# Patient Record
Sex: Female | Born: 1985 | Race: White | Hispanic: No | Smoking: Current every day smoker
Health system: Southern US, Community
[De-identification: ages and names within clinical notes are randomized; demographics above are authoritative.]

## PROBLEM LIST (undated history)

## (undated) DIAGNOSIS — I10 Essential (primary) hypertension: Secondary | ICD-10-CM

## (undated) DIAGNOSIS — F419 Anxiety disorder, unspecified: Secondary | ICD-10-CM

## (undated) HISTORY — PX: CHOLECYSTECTOMY: SHX55

---

## 2016-09-13 ENCOUNTER — Emergency Department (HOSPITAL_COMMUNITY): Payer: Medicare Other

## 2016-09-13 ENCOUNTER — Encounter (HOSPITAL_COMMUNITY): Payer: Self-pay | Admitting: Emergency Medicine

## 2016-09-13 ENCOUNTER — Emergency Department (HOSPITAL_COMMUNITY)
Admission: EM | Admit: 2016-09-13 | Discharge: 2016-09-13 | Disposition: A | Discharge status: Home / Self Care | Payer: Medicare Other | Attending: Emergency Medicine | Admitting: Emergency Medicine

## 2016-09-13 DIAGNOSIS — R51 Headache: Secondary | ICD-10-CM | POA: Insufficient documentation

## 2016-09-13 DIAGNOSIS — F17200 Nicotine dependence, unspecified, uncomplicated: Secondary | ICD-10-CM | POA: Insufficient documentation

## 2016-09-13 DIAGNOSIS — Y929 Unspecified place or not applicable: Secondary | ICD-10-CM | POA: Insufficient documentation

## 2016-09-13 DIAGNOSIS — Y939 Activity, unspecified: Secondary | ICD-10-CM | POA: Diagnosis not present

## 2016-09-13 DIAGNOSIS — S199XXA Unspecified injury of neck, initial encounter: Secondary | ICD-10-CM | POA: Diagnosis present

## 2016-09-13 DIAGNOSIS — S1083XA Contusion of other specified part of neck, initial encounter: Secondary | ICD-10-CM | POA: Diagnosis not present

## 2016-09-13 DIAGNOSIS — F419 Anxiety disorder, unspecified: Secondary | ICD-10-CM | POA: Diagnosis not present

## 2016-09-13 DIAGNOSIS — M549 Dorsalgia, unspecified: Secondary | ICD-10-CM | POA: Insufficient documentation

## 2016-09-13 DIAGNOSIS — T07XXXA Unspecified multiple injuries, initial encounter: Secondary | ICD-10-CM

## 2016-09-13 DIAGNOSIS — Y999 Unspecified external cause status: Secondary | ICD-10-CM | POA: Insufficient documentation

## 2016-09-13 HISTORY — DX: Anxiety disorder, unspecified: F41.9

## 2016-09-13 LAB — POC URINE PREG, ED: Preg Test, Ur: NEGATIVE

## 2016-09-13 MED ORDER — LORAZEPAM 1 MG PO TABS: 1.0000 mg | ORAL_TABLET | Freq: Three times a day (TID) | ORAL | 0 refills | Status: DC | PRN

## 2016-09-13 MED ORDER — DIAZEPAM 5 MG PO TABS
10.0000 mg | ORAL_TABLET | Freq: Once | ORAL | Status: AC
  Administered 2016-09-13: 10 mg via ORAL
  Filled 2016-09-13: qty 2

## 2016-09-13 MED ORDER — ONDANSETRON HCL 4 MG PO TABS
4.0000 mg | ORAL_TABLET | Freq: Once | ORAL | Status: AC
  Administered 2016-09-13: 4 mg via ORAL
  Filled 2016-09-13: qty 1

## 2016-09-13 MED ORDER — CYCLOBENZAPRINE HCL 10 MG PO TABS: 10.0000 mg | ORAL_TABLET | Freq: Three times a day (TID) | ORAL | 0 refills | Status: DC

## 2016-09-13 MED ORDER — HYDROCODONE-ACETAMINOPHEN 5-325 MG PO TABS
2.0000 | ORAL_TABLET | Freq: Once | ORAL | Status: AC
  Administered 2016-09-13: 2 via ORAL
  Filled 2016-09-13: qty 2

## 2016-09-13 MED ORDER — HYDROCODONE-ACETAMINOPHEN 5-325 MG PO TABS: 1.0000 | ORAL_TABLET | ORAL | 0 refills | Status: DC | PRN

## 2016-09-13 NOTE — ED Notes (Signed)
Meal tray given 

## 2016-09-13 NOTE — ED Notes (Signed)
Drink/Crackers given. Meal tray ordered

## 2016-09-13 NOTE — ED Notes (Signed)
Pt taken to xray/ct 

## 2016-09-13 NOTE — Discharge Instructions (Signed)
CT scan done of the maxillofacial bones, soft tissue neck, are negative. X-ray of the chest, and x-ray of the ribs are also negative for fracture. Your blood pressure is elevated, otherwise your vital signs are well within normal limits. Use Flexeril 3 times daily for spasm pain. Please use Tylenol or her ibuprofen for mild pain, use Norco for more severe pain. This medication may cause drowsiness, and/or constipation. Please use this medication with caution.
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
## 2017-07-22 ENCOUNTER — Encounter: Payer: Self-pay | Admitting: Obstetrics & Gynecology

## 2017-07-22 ENCOUNTER — Ambulatory Visit (INDEPENDENT_AMBULATORY_CARE_PROVIDER_SITE_OTHER): Payer: Medicare Other | Admitting: Obstetrics & Gynecology

## 2017-07-22 ENCOUNTER — Encounter (INDEPENDENT_AMBULATORY_CARE_PROVIDER_SITE_OTHER): Payer: Self-pay

## 2017-07-22 ENCOUNTER — Other Ambulatory Visit: Payer: Self-pay

## 2017-07-22 VITALS — BP 120/84 | HR 94 | Ht 67.0 in | Wt 179.0 lb

## 2017-07-22 DIAGNOSIS — Z3201 Encounter for pregnancy test, result positive: Secondary | ICD-10-CM

## 2017-07-22 DIAGNOSIS — N926 Irregular menstruation, unspecified: Secondary | ICD-10-CM

## 2017-07-22 DIAGNOSIS — Z3490 Encounter for supervision of normal pregnancy, unspecified, unspecified trimester: Secondary | ICD-10-CM

## 2017-07-22 LAB — POCT URINE PREGNANCY: Preg Test, Ur: POSITIVE — AB

## 2017-07-22 MED ORDER — OMEPRAZOLE 20 MG PO CPDR: 20.0000 mg | DELAYED_RELEASE_CAPSULE | Freq: Every day | ORAL | 6 refills | Status: AC

## 2017-07-22 NOTE — Progress Notes (Signed)
Chief Complaint  Patient presents with  . Possible Pregnancy      32 y.o. [id number] Patient's last menstrual period was 06/02/2017. The current method of family planning is none.  Outpatient Encounter Medications as of 07/22/2017  Medication Sig  . gabapentin (NEURONTIN) 300 MG capsule 300 mg 3 (three) times daily.   Marland Kitchen omeprazole (PRILOSEC) 20 MG capsule Take 1 capsule (20 mg total) by mouth daily. 1 tablet a day  . [DISCONTINUED] cyclobenzaprine (FLEXERIL) 10 MG tablet Take 1 tablet (10 mg total) by mouth 3 (three) times daily.  . [DISCONTINUED] HYDROcodone-acetaminophen (NORCO/VICODIN) 5-325 MG tablet Take 1 tablet by mouth every 4 (four) hours as needed.  . [DISCONTINUED] LORazepam (ATIVAN) 1 MG tablet Take 1 tablet (1 mg total) by mouth 3 (three) times daily as needed for anxiety.   No facility-administered encounter medications on file as of 07/22/2017.     Subjective Ashlee Porter  Presents to the office today with a recent positive pregnancy test and urine pregnancy test in the office is also positive She did have some spotting a couple of weeks ago but that resolved no pain no cramping She had her IUD removed May 07, 2017 and really did not have an interval menses As result were really unsure about her ovulation and pregnancy dating As a result I am doing a bedside point-of-care vaginal probe ultrasound this morning to evaluate pregnancy status Past Medical History:  Diagnosis Date  . Anxiety     Past Surgical History:  Procedure Laterality Date  . CHOLECYSTECTOMY      OB History    Gravida  7   Para  5   Term      Preterm      AB  1   Living  4     SAB  1   TAB      Ectopic      Multiple      Live Births              Allergies  Allergen Reactions  . Benadryl [Diphenhydramine Hcl (Sleep)]     Social History   Socioeconomic History  . Marital status: Legally Separated    Spouse name: Not on file  . Number of children: Not on  file  . Years of education: Not on file  . Highest education level: Not on file  Occupational History  . Not on file  Social Needs  . Financial resource strain: Not on file  . Food insecurity:    Worry: Not on file    Inability: Not on file  . Transportation needs:    Medical: Not on file    Non-medical: Not on file  Tobacco Use  . Smoking status: Current Every Day Smoker    Packs/day: 1.00    Types: Cigarettes  . Smokeless tobacco: Never Used  Substance and Sexual Activity  . Alcohol use: Not Currently  . Drug use: Yes    Types: Marijuana  . Sexual activity: Yes    Birth control/protection: IUD  Lifestyle  . Physical activity:    Days per week: Not on file    Minutes per session: Not on file  . Stress: Not on file  Relationships  . Social connections:    Talks on phone: Not on file    Gets together: Not on file    Attends religious service: Not on file    Active member of club or organization: Not on file  Attends meetings of clubs or organizations: Not on file    Relationship status: Not on file  Other Topics Concern  . Not on file  Social History Narrative  . Not on file    Family History  Problem Relation Age of Onset  . Cancer Paternal Grandfather        prostate  . Cancer Paternal Grandmother        lung  . Cancer Maternal Grandmother        lung  . Cancer Maternal Grandfather   . Heart disease Father   . Hypertension Mother   . Mental illness Brother   . Mental illness Sister   . Hypertension Sister   . Mental illness Brother     Medications:       Current Outpatient Medications:  .  gabapentin (NEURONTIN) 300 MG capsule, 300 mg 3 (three) times daily. , Disp: , Rfl:  .  omeprazole (PRILOSEC) 20 MG capsule, Take 1 capsule (20 mg total) by mouth daily. 1 tablet a day, Disp: 30 capsule, Rfl: 6  Objective Blood pressure 120/84, pulse 94, height 5\' 7"  (1.702 m), weight 179 lb (81.2 kg), last menstrual period 06/02/2017.  Gen WDWN female  NAD  Pertinent ROS   Labs or studies The uterus appears to be normal with a thickened endometrial stripe with multiple small cystic areas There is an area in the superior portion of the endometrium which appears to be a very small gestational sac and possibly even a very early yolk sac No fetal pole is seen and would not be consistent with this gestational sac and yolk sac appearance Therefore think she is probably right at 5 weeks relational age and since I see what appears to be a eccentric gestational sac I do not think doing ECGs is going to be helpful  We will repeat her sonogram in 2 weeks for evaluation    Impression Diagnoses this Encounter::   ICD-10-CM   1. Early stage of pregnancy Z34.90 US OB Transvaginal  2. Positive pregnancy test Z32.01 POCT urine pregnancy    Established relevant diagnosis(es):   Plan/Recommendations: Meds ordered this encounter  Medications  . omeprazole (PRILOSEC) 20 MG capsule    Sig: Take 1 capsule (20 mg total) by mouth daily. 1 tablet a day    Dispense:  30 capsule    Refill:  6    Labs or Scans Ordered: Orders Placed This Encounter  Procedures  . US OB Transvaginal  . POCT urine pregnancy    Management::  Repeat scan with NOB 2 weeks Follow up Return in about 2 weeks (around 08/05/2017) for vaginal sonogram, NOB appt.     All questions were answered.

## 2017-07-29 ENCOUNTER — Other Ambulatory Visit: Payer: Self-pay

## 2017-07-29 ENCOUNTER — Emergency Department (HOSPITAL_COMMUNITY)
Admission: EM | Admit: 2017-07-29 | Discharge: 2017-07-29 | Disposition: A | Discharge status: Home / Self Care | Payer: Medicare Other | Attending: Emergency Medicine | Admitting: Emergency Medicine

## 2017-07-29 ENCOUNTER — Encounter (HOSPITAL_COMMUNITY): Payer: Self-pay | Admitting: Emergency Medicine

## 2017-07-29 DIAGNOSIS — O208 Other hemorrhage in early pregnancy: Secondary | ICD-10-CM | POA: Diagnosis not present

## 2017-07-29 DIAGNOSIS — O99330 Smoking (tobacco) complicating pregnancy, unspecified trimester: Secondary | ICD-10-CM | POA: Diagnosis not present

## 2017-07-29 DIAGNOSIS — F17210 Nicotine dependence, cigarettes, uncomplicated: Secondary | ICD-10-CM | POA: Diagnosis not present

## 2017-07-29 DIAGNOSIS — O200 Threatened abortion: Secondary | ICD-10-CM | POA: Insufficient documentation

## 2017-07-29 DIAGNOSIS — O10019 Pre-existing essential hypertension complicating pregnancy, unspecified trimester: Secondary | ICD-10-CM | POA: Insufficient documentation

## 2017-07-29 DIAGNOSIS — R102 Pelvic and perineal pain: Secondary | ICD-10-CM | POA: Diagnosis present

## 2017-07-29 HISTORY — DX: Essential (primary) hypertension: I10

## 2017-07-29 LAB — CBC WITH DIFFERENTIAL/PLATELET
Basophils Absolute: 0 10*3/uL (ref 0.0–0.1)
Basophils Relative: 0 %
Eosinophils Absolute: 0.2 10*3/uL (ref 0.0–0.7)
Eosinophils Relative: 2 %
HEMATOCRIT: 44.4 % (ref 36.0–46.0)
Hemoglobin: 15 g/dL (ref 12.0–15.0)
LYMPHS PCT: 23 %
Lymphs Abs: 2.8 10*3/uL (ref 0.7–4.0)
MCH: 30.2 pg (ref 26.0–34.0)
MCHC: 33.8 g/dL (ref 30.0–36.0)
MCV: 89.5 fL (ref 78.0–100.0)
Monocytes Absolute: 0.8 10*3/uL (ref 0.1–1.0)
Monocytes Relative: 7 %
NEUTROS ABS: 8.5 10*3/uL — AB (ref 1.7–7.7)
Neutrophils Relative %: 68 %
Platelets: 350 10*3/uL (ref 150–400)
RBC: 4.96 MIL/uL (ref 3.87–5.11)
RDW: 13 % (ref 11.5–15.5)
WBC: 12.4 10*3/uL — ABNORMAL HIGH (ref 4.0–10.5)

## 2017-07-29 LAB — ABO/RH: ABO/RH(D): A POS

## 2017-07-29 LAB — WET PREP, GENITAL
CLUE CELLS WET PREP: NONE SEEN
Sperm: NONE SEEN
Trich, Wet Prep: NONE SEEN
Yeast Wet Prep HPF POC: NONE SEEN

## 2017-07-29 LAB — HCG, QUANTITATIVE, PREGNANCY: HCG, BETA CHAIN, QUANT, S: 614 m[IU]/mL — AB (ref <5)

## 2017-07-29 MED ORDER — ACETAMINOPHEN 325 MG PO TABS
650.0000 mg | ORAL_TABLET | Freq: Once | ORAL | Status: AC
  Administered 2017-07-29: 650 mg via ORAL
  Filled 2017-07-29: qty 2

## 2017-07-29 NOTE — Discharge Instructions (Addendum)
Keep your scheduled appointment with Dr[facility] at 1:30 PM.  Ask Dr. Emelda FearFerguson or Dr.Eure to help you to stop smoking.  Your blood type is  A Positive (A+).  It is safe to take Tylenol as directed for pain

## 2017-07-29 NOTE — ED Provider Notes (Signed)
[facility] EMERGENCY DEPARTMENT Provider Note   CSN: [id number] Arrival date & time: 07/29/17  2027     History   Chief Complaint Chief Complaint  Patient presents with  . Abdominal Pain    HPI Ashlee Porter is a 32 y.o. female.  Planes of crampy suprapubic pain gradual onset 3 days ago becoming worse today.  Developed vaginal bleeding onset today.  She denies any lightheadedness no fever no vomiting.  She reports that she had a pelvic ultrasound 7 days ago at [facility] which showed an intrauterine pregnancy.  No urinary symptoms.  No anorexia.  No other associated symptoms.  No treatment prior to coming here nothing makes symptoms better or worse.  She is uncertain when her last normal menstrual period was.   HPI  Past Medical History:  Diagnosis Date  . Anxiety   . Hypertension     There are no active problems to display for this patient.   Past Surgical History:  Procedure Laterality Date  . CHOLECYSTECTOMY       OB History    Gravida  7   Para  5   Term      Preterm      AB  1   Living  4     SAB  1   TAB      Ectopic      Multiple      Live Births             1 miscarriage 1 stillborn  Home Medications    Prior to Admission medications   Medication Sig Start Date End Date Taking? Authorizing Provider  gabapentin (NEURONTIN) 300 MG capsule 300 mg 3 (three) times daily.  07/16/17   [provider]  omeprazole (PRILOSEC) 20 MG capsule Take 1 capsule (20 mg total) by mouth daily. 1 tablet a day 07/22/17   Lazaro Arms, MD    Family History Family History  Problem Relation Age of Onset  . Cancer Paternal Grandfather        prostate  . Cancer Paternal Grandmother        lung  . Cancer Maternal Grandmother        lung  . Cancer Maternal Grandfather   . Heart disease Father   . Hypertension Mother   . Mental illness Brother   . Mental illness Sister   . Hypertension Sister   . Mental illness Brother     Social  History Social History   Tobacco Use  . Smoking status: Current Every Day Smoker    Packs/day: 1.00    Types: Cigarettes  . Smokeless tobacco: Never Used  Substance Use Topics  . Alcohol use: Not Currently  . Drug use: Yes    Types: Marijuana     Allergies   Benadryl [diphenhydramine hcl (sleep)]   Review of Systems Review of Systems  Gastrointestinal: Positive for abdominal pain.       Suprapubic cramping  Genitourinary: Positive for vaginal bleeding.       Pregnant  All other systems reviewed and are negative.    Physical Exam Updated Vital Signs BP (!) 144/105   Temp 98 F (36.7 C)   Resp 18   Ht 5\' 7"  (1.702 m)   Wt 81.2 kg (179 lb)   LMP 06/02/2017   SpO2 95%   BMI 28.04 kg/m   Physical Exam  Constitutional: She appears well-developed and well-nourished.  HENT:  Head: Normocephalic and atraumatic.  Eyes: Pupils are  equal, round, and reactive to light. Conjunctivae are normal.  Neck: Neck supple. No tracheal deviation present. No thyromegaly present.  Cardiovascular: Normal rate and regular rhythm.  No murmur heard. Pulmonary/Chest: Effort normal and breath sounds normal.  Abdominal: Soft. Bowel sounds are normal. She exhibits no distension. There is no tenderness.  Genitourinary:  Genitourinary Comments: Exam no external lesion.  Slight amount of dark blood coming from loss.  Cervical loss closed.  No adnexal tenderness or masses.  No uterine tenderness.  Musculoskeletal: Normal range of motion. She exhibits no edema or tenderness.  Neurological: She is alert. Coordination normal.  Skin: Skin is warm and dry. No rash noted.  Psychiatric: She has a normal mood and affect.  Nursing note and vitals reviewed.    ED Treatments / Results  Labs (all labs ordered are listed, but only abnormal results are displayed) Labs Reviewed  WET PREP, GENITAL  CBC WITH DIFFERENTIAL/PLATELET  RPR  HIV ANTIBODY (ROUTINE TESTING)  HCG, QUANTITATIVE, PREGNANCY   ABO/RH  GC/CHLAMYDIA PROBE AMP ([place]) NOT AT [facility]   Results for orders placed or performed during the hospital encounter of 07/29/17  Wet prep, genital  Result Value Ref Range   Yeast Wet Prep HPF POC NONE SEEN NONE SEEN   Trich, Wet Prep NONE SEEN NONE SEEN   Clue Cells Wet Prep HPF POC NONE SEEN NONE SEEN   WBC, Wet Prep HPF POC MANY (A) NONE SEEN   Sperm NONE SEEN   CBC with Differential/Platelet  Result Value Ref Range   WBC 12.4 (H) 4.0 - 10.5 K/uL   RBC 4.96 3.87 - 5.11 MIL/uL   Hemoglobin 15.0 12.0 - 15.0 g/dL   HCT 16.144.4 09.636.0 - 04.546.0 %   MCV 89.5 78.0 - 100.0 fL   MCH 30.2 26.0 - 34.0 pg   MCHC 33.8 30.0 - 36.0 g/dL   RDW 40.913.0 81.111.5 - 91.415.5 %   Platelets 350 150 - 400 K/uL   Neutrophils Relative % 68 %   Neutro Abs 8.5 (H) 1.7 - 7.7 K/uL   Lymphocytes Relative 23 %   Lymphs Abs 2.8 0.7 - 4.0 K/uL   Monocytes Relative 7 %   Monocytes Absolute 0.8 0.1 - 1.0 K/uL   Eosinophils Relative 2 %   Eosinophils Absolute 0.2 0.0 - 0.7 K/uL   Basophils Relative 0 %   Basophils Absolute 0.0 0.0 - 0.1 K/uL  hCG, quantitative, pregnancy  Result Value Ref Range   hCG, Beta Chain, Quant, S 614 (H) <5 mIU/mL  ABO/Rh  Result Value Ref Range   ABO/RH(D)      A POS Performed at [facility], [address] [place], [place] [address]    No results found. EKG None  Radiology No results found.  Procedures Procedures (including critical care time)  Medications Ordered in ED Medications  acetaminophen (TYLENOL) tablet 650 mg (has no administration in time range)   Patient administered Tylenol for pain  Initial Impression / Assessment and Plan / ED Course  I have reviewed the triage vital signs and the nursing notes.  Pertinent labs & imaging results that were available during my care of the patient were reviewed by me and considered in my medical decision making (see chart for details).    10:15 PM pain improved after treatment with Tylenol. I Counseled patient for  5 minutes on smoking cessation Symptoms and exam consistent with threatened miscarriage.  Plan bed rest pelvic rest.  Spoke with Dr. Emelda FearFerguson by telephone.  An appointment has been scheduled for her for tomorrow to be seen in the office 1:30 PM.  Dr. Emelda Fear will check on quantitative hCG no further treatment needed Final Clinical Impressions(s) / ED Diagnoses  Diagnosis #1 threatened miscarriage Final diagnoses:  None   #2 tobacco abuse ED Discharge Orders    None       Doug Sou, MD 07/29/17 2217

## 2017-07-29 NOTE — ED Triage Notes (Signed)
Pt c/o abd cramping for a couple of days, but states the bleeding started today.

## 2017-07-30 ENCOUNTER — Ambulatory Visit (INDEPENDENT_AMBULATORY_CARE_PROVIDER_SITE_OTHER): Payer: Medicare Other | Admitting: Obstetrics and Gynecology

## 2017-07-30 ENCOUNTER — Encounter: Payer: Self-pay | Admitting: Obstetrics and Gynecology

## 2017-07-30 DIAGNOSIS — R109 Unspecified abdominal pain: Secondary | ICD-10-CM

## 2017-07-30 DIAGNOSIS — O200 Threatened abortion: Secondary | ICD-10-CM | POA: Diagnosis not present

## 2017-07-30 NOTE — Progress Notes (Signed)
Patient ID: Ashlee Porter, female   DOB: 05-15-1985, 32 y.o.   MRN: [id number]   [facility]  @DATE @            Patient name: Ashlee Porter MRN [id number]  Date of birth: 05-15-1985  CC & HPI:  Ashlee Porter is a 32 y.o. female presenting today after a recent ED visit yesterday, 07/29/2017 for a threatened miscarriage. She has been experiencing heavy bleeding and cramping. The patient denies fever, chills or any other symptoms or complaints at this time.  She had an elevated hCG level of 614, with no prior hCG values other than a positive urine hCG.  She had allegedly had a quantitative hCG drawn that was improperly labeled at outside hospital and therefore cannot be relied upon ROS:  ROS +heavy vaginal bleeding +abdominal cramping -fever -chills All systems are negative except as noted in the HPI and PMH.   Pertinent History Reviewed:   Reviewed:  Medical         Past Medical History:  Diagnosis Date  . Anxiety   . Hypertension                               Surgical Hx:    Past Surgical History:  Procedure Laterality Date  . CHOLECYSTECTOMY     Medications: Reviewed & Updated - see associated section                       Current Outpatient Medications:  .  cloNIDine (CATAPRES) 0.3 MG tablet, Take 0.3 mg by mouth 2 (two) times daily., Disp: , Rfl:  .  omeprazole (PRILOSEC) 20 MG capsule, Take 1 capsule (20 mg total) by mouth daily. 1 tablet a day, Disp: 30 capsule, Rfl: 6 .  Prenatal Vit-Fe Fumarate-FA (PRENATAL MULTIVITAMIN) TABS tablet, Take 1 tablet by mouth daily at 12 noon., Disp: , Rfl:  .  gabapentin (NEURONTIN) 300 MG capsule, Take 300 mg by mouth 3 (three) times daily as needed (pain). , Disp: , Rfl:    Social History: Reviewed -  reports that she has been smoking cigarettes.  She has been smoking about 1.00 pack per day. She has never used smokeless tobacco.  Objective Findings:  Vitals: Blood pressure 132/86, pulse 82, height 5\' 7"  (1.702 m), weight 173  lb 9.6 oz (78.7 kg), last menstrual period 06/02/2017.  PHYSICAL EXAMINATION General appearance - alert, well appearing, and in no distress, oriented to person, place, and time and overweight Mental status - alert, oriented to person, place, and time, normal mood, behavior, speech, dress, motor activity, and thought processes, affect appropriate to mood  PELVIC DEFERRED  Assessment & Plan:   A:  1. Threatened Miscarriage , uncertain viability.  P:  1. [facility], to be [facility] that a[facility] 2. F/U 1 week with JVF set up as mechanism to insure followup   By signing my name below, I, Diona BrownerJennifer Gorman, attest that this documentation has been prepared under the direction and in the presence of Tilda BurrowFerguson, Kacin Dancy V, MD. Electronically Signed: Diona BrownerJennifer Gorman, Medical Scribe. 07/30/17. 2:08 PM.  I personally performed the services described in this documentation, which was SCRIBED in my presence. The recorded information has been reviewed and considered accurate. It has been edited as necessary during review. Tilda BurrowJohn V Brison Fiumara, MD

## 2017-07-31 LAB — HIV ANTIBODY (ROUTINE TESTING W REFLEX): HIV Screen 4th Generation wRfx: NONREACTIVE

## 2017-07-31 LAB — GC/CHLAMYDIA PROBE AMP (~~LOC~~) NOT AT ARMC
Chlamydia: NEGATIVE
Neisseria Gonorrhea: NEGATIVE

## 2017-07-31 LAB — RPR: RPR Ser Ql: NONREACTIVE

## 2017-08-01 ENCOUNTER — Telehealth: Payer: Self-pay | Admitting: *Deleted

## 2017-08-01 ENCOUNTER — Other Ambulatory Visit (HOSPITAL_COMMUNITY)
Admission: RE | Admit: 2017-08-01 | Discharge: 2017-08-01 | Disposition: A | Discharge status: Home / Self Care | Payer: Medicare Other | Source: Ambulatory Visit | Attending: Obstetrics and Gynecology | Admitting: Obstetrics and Gynecology

## 2017-08-01 DIAGNOSIS — O200 Threatened abortion: Secondary | ICD-10-CM | POA: Insufficient documentation

## 2017-08-01 LAB — HCG, QUANTITATIVE, PREGNANCY: hCG, Beta Chain, Quant, S: 465 m[IU]/mL — ABNORMAL HIGH (ref <5)

## 2017-08-01 NOTE — Telephone Encounter (Signed)
Patient called requesting HCG results. Informed patient HCG was dropping and did indicate a miscarriage at this time.  States she was told when she saw Dr Emelda FearFerguson on 4/3 that "medication could be prescribed to speed up the process even though she is bleeding and cramping". I do not see any mention of it in the note. Please advise.

## 2017-08-01 NOTE — Telephone Encounter (Signed)
Informed patient that per Dr Despina HiddenEure "Her HCG is certainly dropping but being that low I do not think there is a need for cytotec, she will experience period like bleeding, maybe a bit heavier and a bit longer, but with HCG at that level medication, cytotec, will not be helpful".  Informed HCG levels will be monitored until <5 and may be checked again at her next visit. Patient verbalized understanding with no further questions.

## 2017-08-01 NOTE — Telephone Encounter (Signed)
Her HCG is certainly dropping but being that low I do not think there is a need for cytotec, she will experience period like bleeding, maybe a bit heavier and a bit longer, but with HCG at that level medication, cytotec, will not be helpful, OK?

## 2017-08-03 ENCOUNTER — Telehealth: Payer: Self-pay | Admitting: Obstetrics and Gynecology

## 2017-08-03 NOTE — Telephone Encounter (Signed)
Phone call to Pt: she's passing tissue and feels like discomfort is reducing.  IMP: resolving spont Ab. Blood type A Pos.  Will follow and expect spont resolution of miscarriage.

## 2017-08-04 ENCOUNTER — Other Ambulatory Visit: Payer: Self-pay | Admitting: Obstetrics & Gynecology

## 2017-08-04 DIAGNOSIS — O208 Other hemorrhage in early pregnancy: Secondary | ICD-10-CM

## 2017-08-04 DIAGNOSIS — Z3490 Encounter for supervision of normal pregnancy, unspecified, unspecified trimester: Secondary | ICD-10-CM

## 2017-08-04 DIAGNOSIS — O209 Hemorrhage in early pregnancy, unspecified: Secondary | ICD-10-CM

## 2017-08-05 ENCOUNTER — Other Ambulatory Visit: Payer: Medicare Other

## 2017-08-06 ENCOUNTER — Ambulatory Visit: Payer: Medicare Other | Admitting: Obstetrics and Gynecology

## 2017-09-10 ENCOUNTER — Encounter: Payer: Self-pay | Admitting: Obstetrics & Gynecology

## 2017-09-24 ENCOUNTER — Ambulatory Visit: Payer: Medicare Other | Admitting: Adult Health

## 2017-10-03 ENCOUNTER — Ambulatory Visit: Payer: Medicare Other | Admitting: Adult Health

## 2017-12-12 ENCOUNTER — Emergency Department (HOSPITAL_COMMUNITY)
Admission: EM | Admit: 2017-12-12 | Discharge: 2017-12-12 | Disposition: A | Discharge status: Home / Self Care | Payer: Medicare Other | Attending: Emergency Medicine | Admitting: Emergency Medicine

## 2017-12-12 ENCOUNTER — Other Ambulatory Visit: Payer: Self-pay

## 2017-12-12 ENCOUNTER — Encounter (HOSPITAL_COMMUNITY): Payer: Self-pay | Admitting: *Deleted

## 2017-12-12 DIAGNOSIS — R51 Headache: Secondary | ICD-10-CM | POA: Diagnosis not present

## 2017-12-12 DIAGNOSIS — R197 Diarrhea, unspecified: Secondary | ICD-10-CM | POA: Insufficient documentation

## 2017-12-12 DIAGNOSIS — Z79899 Other long term (current) drug therapy: Secondary | ICD-10-CM | POA: Diagnosis not present

## 2017-12-12 DIAGNOSIS — F17210 Nicotine dependence, cigarettes, uncomplicated: Secondary | ICD-10-CM | POA: Diagnosis not present

## 2017-12-12 DIAGNOSIS — I10 Essential (primary) hypertension: Secondary | ICD-10-CM | POA: Insufficient documentation

## 2017-12-12 DIAGNOSIS — R519 Headache, unspecified: Secondary | ICD-10-CM

## 2017-12-12 DIAGNOSIS — N3000 Acute cystitis without hematuria: Secondary | ICD-10-CM | POA: Diagnosis not present

## 2017-12-12 DIAGNOSIS — R112 Nausea with vomiting, unspecified: Secondary | ICD-10-CM | POA: Diagnosis not present

## 2017-12-12 LAB — COMPREHENSIVE METABOLIC PANEL
ALBUMIN: 3.9 g/dL (ref 3.5–5.0)
ALT: 17 U/L (ref 0–44)
ANION GAP: 9 (ref 5–15)
AST: 13 U/L — ABNORMAL LOW (ref 15–41)
Alkaline Phosphatase: 91 U/L (ref 38–126)
BILIRUBIN TOTAL: 0.6 mg/dL (ref 0.3–1.2)
BUN: 8 mg/dL (ref 6–20)
CO2: 23 mmol/L (ref 22–32)
Calcium: 8.9 mg/dL (ref 8.9–10.3)
Chloride: 103 mmol/L (ref 98–111)
Creatinine, Ser: 0.56 mg/dL (ref 0.44–1.00)
GFR calc Af Amer: >60 mL/min (ref >60)
Glucose, Bld: 120 mg/dL — ABNORMAL HIGH (ref 70–99)
Potassium: 3.5 mmol/L (ref 3.5–5.1)
Sodium: 135 mmol/L (ref 135–145)
TOTAL PROTEIN: 7 g/dL (ref 6.5–8.1)

## 2017-12-12 LAB — URINALYSIS, ROUTINE W REFLEX MICROSCOPIC
BILIRUBIN URINE: NEGATIVE
Glucose, UA: NEGATIVE mg/dL
Hgb urine dipstick: NEGATIVE
KETONES UR: NEGATIVE mg/dL
NITRITE: NEGATIVE
Protein, ur: NEGATIVE mg/dL
Specific Gravity, Urine: 1.005 (ref 1.005–1.030)
pH: 5 (ref 5.0–8.0)

## 2017-12-12 LAB — CBC
HCT: 42.7 % (ref 36.0–46.0)
HEMOGLOBIN: 14.5 g/dL (ref 12.0–15.0)
MCH: 29.6 pg (ref 26.0–34.0)
MCHC: 34 g/dL (ref 30.0–36.0)
MCV: 87.1 fL (ref 78.0–100.0)
Platelets: 339 10*3/uL (ref 150–400)
RBC: 4.9 MIL/uL (ref 3.87–5.11)
RDW: 12.6 % (ref 11.5–15.5)
WBC: 10.3 10*3/uL (ref 4.0–10.5)

## 2017-12-12 LAB — LIPASE, BLOOD: Lipase: 25 U/L (ref 11–51)

## 2017-12-12 LAB — PREGNANCY, URINE: PREG TEST UR: NEGATIVE

## 2017-12-12 MED ORDER — CEPHALEXIN 500 MG PO CAPS: 500.0000 mg | ORAL_CAPSULE | Freq: Three times a day (TID) | ORAL | 0 refills | Status: AC

## 2017-12-12 MED ORDER — SODIUM CHLORIDE 0.9 % IV BOLUS
1000.0000 mL | Freq: Once | INTRAVENOUS | Status: AC
  Administered 2017-12-12: 1000 mL via INTRAVENOUS

## 2017-12-12 MED ORDER — ONDANSETRON 4 MG PO TBDP: 4.0000 mg | ORAL_TABLET | Freq: Three times a day (TID) | ORAL | 0 refills | Status: AC | PRN

## 2017-12-12 MED ORDER — PROMETHAZINE HCL 25 MG/ML IJ SOLN
12.5000 mg | Freq: Once | INTRAMUSCULAR | Status: AC
  Administered 2017-12-12: 12.5 mg via INTRAVENOUS
  Filled 2017-12-12: qty 1

## 2017-12-12 NOTE — ED Provider Notes (Signed)
[facility] EMERGENCY DEPARTMENT Provider Note   CSN: [id number] Arrival date & time: 12/12/17  2131     History   Chief Complaint Chief Complaint  Patient presents with  . Emesis    HPI Ashlee Porter is a 32 y.o. female.  HPI Patient presents with nausea vomiting and diarrhea.  Began around 3 days ago.  Also severe headache.  Headache is in the front of her head and pounding.  No difficulty moving her neck.  Has had chills without frank fever.  Also some dull abdominal pain.  States she has not had much of an appetite.  Also has had some dysuria. Past Medical History:  Diagnosis Date  . Anxiety   . Hypertension     Patient Active Problem List   Diagnosis Date Noted  . Threatened abortion in early pregnancy 07/30/2017    Past Surgical History:  Procedure Laterality Date  . CHOLECYSTECTOMY       OB History    Gravida  7   Para  5   Term      Preterm      AB  1   Living  4     SAB  1   TAB      Ectopic      Multiple      Live Births               Home Medications    Prior to Admission medications   Medication Sig Start Date End Date Taking? Authorizing Provider  cephALEXin (KEFLEX) 500 MG capsule Take 1 capsule (500 mg total) by mouth 3 (three) times daily. 12/12/17   Benjiman CorePickering, Abbygail [place]hoite, MD  cloNIDine (CATAPRES) 0.3 MG tablet Take 0.3 mg by mouth 2 (two) times daily.    [provider]  gabapentin (NEURONTIN) 300 MG capsule Take 300 mg by mouth 3 (three) times daily as needed (pain).  07/16/17   [provider]  omeprazole (PRILOSEC) 20 MG capsule Take 1 capsule (20 mg total) by mouth daily. 1 tablet a day 07/22/17   Lazaro ArmsEure, Luther H, MD  ondansetron (ZOFRAN-ODT) 4 MG disintegrating tablet Take 1 tablet (4 mg total) by mouth every 8 (eight) hours as needed for nausea or vomiting. 12/12/17   Benjiman CorePickering, Eulah Walkup, MD  Prenatal Vit-Fe Fumarate-FA (PRENATAL MULTIVITAMIN) TABS tablet Take 1 tablet by mouth daily at 12 noon.    [provider]    Family History Family History  Problem Relation Age of Onset  . Cancer Paternal Grandfather        prostate  . Cancer Paternal Grandmother        lung  . Cancer Maternal Grandmother        lung  . Cancer Maternal Grandfather   . Heart disease Father   . Hypertension Mother   . Mental illness Brother   . Mental illness Sister   . Hypertension Sister   . Mental illness Brother     Social History Social History   Tobacco Use  . Smoking status: Current Every Day Smoker    Packs/day: 1.00    Types: Cigarettes  . Smokeless tobacco: Never Used  Substance Use Topics  . Alcohol use: Not Currently  . Drug use: Yes    Types: Marijuana     Allergies   Benadryl [diphenhydramine hcl (sleep)]   Review of Systems Review of Systems  Constitutional: Positive for chills. Negative for appetite change.  Respiratory: Negative for shortness of breath.   Gastrointestinal: Positive for  abdominal pain, diarrhea, nausea and vomiting.  Genitourinary: Positive for dysuria.  Musculoskeletal: Negative for back pain.  Skin: Negative for rash.  Neurological: Positive for headaches. Negative for weakness.  Hematological: Negative for adenopathy.  Psychiatric/Behavioral: Negative for confusion.     Physical Exam Updated Vital Signs BP 120/87   Pulse 73   Temp 98.1 F (36.7 C) (Oral)   Resp 16   Ht 5\' 7"  (1.702 m)   Wt 83.9 kg   LMP 11/27/2017   SpO2 95%   Breastfeeding? Unknown   BMI 28.98 kg/m   Physical Exam  Constitutional: She appears well-developed.  Patient appears uncomfortable.  HENT:  Head: Atraumatic.  Eyes: Pupils are equal, round, and reactive to light.  Neck: Neck supple.  No meningismus.  Cardiovascular: Normal rate.  Pulmonary/Chest: Effort normal.  Abdominal: There is no tenderness.  Neurological: She is alert.  Skin: Skin is warm. Capillary refill takes less than 2 seconds.     ED Treatments / Results  Labs (all labs ordered are  listed, but only abnormal results are displayed) Labs Reviewed  COMPREHENSIVE METABOLIC PANEL - Abnormal; Notable for the following components:      Result Value   Glucose, Bld 120 (*)    AST 13 (*)    All other components within normal limits  URINALYSIS, ROUTINE W REFLEX MICROSCOPIC - Abnormal; Notable for the following components:   Color, Urine STRAW (*)    APPearance HAZY (*)    Leukocytes, UA TRACE (*)    Bacteria, UA MANY (*)    All other components within normal limits  LIPASE, BLOOD  CBC  PREGNANCY, URINE    EKG None  Radiology No results found.  Procedures Procedures (including critical care time)  Medications Ordered in ED Medications  sodium chloride 0.9 % bolus 1,000 mL (0 mLs Intravenous Stopped 12/12/17 2316)  promethazine (PHENERGAN) injection 12.5 mg (12.5 mg Intravenous Given 12/12/17 2159)     Initial Impression / Assessment and Plan / ED Course  I have reviewed the triage vital signs and the nursing notes.  Pertinent labs & imaging results that were available during my care of the patient were reviewed by me and considered in my medical decision making (see chart for details).     Patient with nausea vomiting diarrhea.  Headache.  Feels better after treatment.  Likely dehydration component.  Urine does however show likely infection.  States she has had dysuria.  Will treat with antibiotics.  Also give antiemetics.  Feels better after some Phenergan and will be discharged home per  Final Clinical Impressions(s) / ED Diagnoses   Final diagnoses:  Nausea vomiting and diarrhea  Acute cystitis without hematuria  Nonintractable headache, unspecified chronicity pattern, unspecified headache type    ED Discharge Orders         Ordered    cephALEXin (KEFLEX) 500 MG capsule  3 times daily     12/12/17 2335    ondansetron (ZOFRAN-ODT) 4 MG disintegrating tablet  Every 8 hours PRN     12/12/17 2335           Benjiman CorePickering, Meriah Shands, MD 12/12/17 2351

## 2017-12-12 NOTE — ED Triage Notes (Signed)
Pt c/o n/v/d, fever, chills, headache that started 3 days ago, worse tonight,

## 2017-12-12 NOTE — ED Notes (Signed)
Pt cannot provide urine specimen at this time.

## 2017-12-12 NOTE — ED Notes (Signed)
ED Provider at bedside. 

## 2017-12-18 ENCOUNTER — Emergency Department (HOSPITAL_COMMUNITY)
Admission: EM | Admit: 2017-12-18 | Discharge: 2017-12-18 | Discharge status: Against Medical Advice | Payer: Medicare Other | Attending: Emergency Medicine | Admitting: Emergency Medicine

## 2017-12-18 ENCOUNTER — Encounter (HOSPITAL_COMMUNITY): Payer: Self-pay | Admitting: Emergency Medicine

## 2017-12-18 ENCOUNTER — Other Ambulatory Visit: Payer: Self-pay

## 2017-12-18 DIAGNOSIS — F17210 Nicotine dependence, cigarettes, uncomplicated: Secondary | ICD-10-CM | POA: Insufficient documentation

## 2017-12-18 DIAGNOSIS — F1210 Cannabis abuse, uncomplicated: Secondary | ICD-10-CM | POA: Diagnosis not present

## 2017-12-18 DIAGNOSIS — Z79899 Other long term (current) drug therapy: Secondary | ICD-10-CM | POA: Diagnosis not present

## 2017-12-18 DIAGNOSIS — R112 Nausea with vomiting, unspecified: Secondary | ICD-10-CM

## 2017-12-18 DIAGNOSIS — R509 Fever, unspecified: Secondary | ICD-10-CM | POA: Diagnosis not present

## 2017-12-18 DIAGNOSIS — I10 Essential (primary) hypertension: Secondary | ICD-10-CM | POA: Diagnosis not present

## 2017-12-18 DIAGNOSIS — M545 Low back pain: Secondary | ICD-10-CM | POA: Diagnosis present

## 2017-12-18 DIAGNOSIS — R5383 Other fatigue: Secondary | ICD-10-CM | POA: Insufficient documentation

## 2017-12-18 LAB — CBC WITH DIFFERENTIAL/PLATELET
Basophils Absolute: 0 10*3/uL (ref 0.0–0.1)
Basophils Relative: 0 %
EOS ABS: 0.2 10*3/uL (ref 0.0–0.7)
Eosinophils Relative: 2 %
HEMATOCRIT: 40.6 % (ref 36.0–46.0)
Hemoglobin: 14.2 g/dL (ref 12.0–15.0)
LYMPHS ABS: 2.5 10*3/uL (ref 0.7–4.0)
Lymphocytes Relative: 27 %
MCH: 30.1 pg (ref 26.0–34.0)
MCHC: 35 g/dL (ref 30.0–36.0)
MCV: 86 fL (ref 78.0–100.0)
MONO ABS: 0.6 10*3/uL (ref 0.1–1.0)
MONOS PCT: 6 %
Neutro Abs: 5.9 10*3/uL (ref 1.7–7.7)
Neutrophils Relative %: 65 %
Platelets: 340 10*3/uL (ref 150–400)
RBC: 4.72 MIL/uL (ref 3.87–5.11)
RDW: 12.6 % (ref 11.5–15.5)
WBC: 9.2 10*3/uL (ref 4.0–10.5)

## 2017-12-18 LAB — COMPREHENSIVE METABOLIC PANEL
ALK PHOS: 76 U/L (ref 38–126)
ALT: 8 U/L (ref 0–44)
ANION GAP: 6 (ref 5–15)
AST: 10 U/L — ABNORMAL LOW (ref 15–41)
Albumin: 3.9 g/dL (ref 3.5–5.0)
BILIRUBIN TOTAL: 0.6 mg/dL (ref 0.3–1.2)
BUN: 5 mg/dL — ABNORMAL LOW (ref 6–20)
CALCIUM: 8.9 mg/dL (ref 8.9–10.3)
CO2: 26 mmol/L (ref 22–32)
Chloride: 105 mmol/L (ref 98–111)
Creatinine, Ser: 0.54 mg/dL (ref 0.44–1.00)
GFR calc non Af Amer: >60 mL/min (ref >60)
Glucose, Bld: 98 mg/dL (ref 70–99)
Potassium: 3.3 mmol/L — ABNORMAL LOW (ref 3.5–5.1)
SODIUM: 137 mmol/L (ref 135–145)
TOTAL PROTEIN: 7 g/dL (ref 6.5–8.1)

## 2017-12-18 LAB — URINALYSIS, ROUTINE W REFLEX MICROSCOPIC
Bilirubin Urine: NEGATIVE
GLUCOSE, UA: NEGATIVE mg/dL
KETONES UR: NEGATIVE mg/dL
NITRITE: NEGATIVE
PH: 7 (ref 5.0–8.0)
Protein, ur: NEGATIVE mg/dL
Specific Gravity, Urine: 1.006 (ref 1.005–1.030)

## 2017-12-18 LAB — LIPASE, BLOOD: LIPASE: 24 U/L (ref 11–51)

## 2017-12-18 LAB — PREGNANCY, URINE: Preg Test, Ur: NEGATIVE

## 2017-12-18 MED ORDER — PROMETHAZINE HCL 25 MG/ML IJ SOLN
12.5000 mg | Freq: Once | INTRAMUSCULAR | Status: AC
  Administered 2017-12-18: 12.5 mg via INTRAVENOUS
  Filled 2017-12-18: qty 1

## 2017-12-18 MED ORDER — ACETAMINOPHEN 325 MG PO TABS
ORAL_TABLET | ORAL | Status: AC
  Filled 2017-12-18: qty 2

## 2017-12-18 MED ORDER — ACETAMINOPHEN 325 MG PO TABS
650.0000 mg | ORAL_TABLET | Freq: Once | ORAL | Status: AC
  Administered 2017-12-18: 650 mg via ORAL

## 2017-12-18 MED ORDER — SODIUM CHLORIDE 0.9 % IV BOLUS
1000.0000 mL | Freq: Once | INTRAVENOUS | Status: AC
  Administered 2017-12-18: 1000 mL via INTRAVENOUS

## 2017-12-18 MED ORDER — SODIUM CHLORIDE 0.9 % IV BOLUS: 1000.0000 mL | Freq: Once | INTRAVENOUS | Status: DC

## 2017-12-18 NOTE — ED Notes (Signed)
Pt stated that she had to go to the bathroom never returned back.

## 2017-12-18 NOTE — ED Triage Notes (Signed)
C/o lower back pain and lt ear pain.  Seen here 08/16 and tx with abx that she has finished.  Reports she has not been able to keep any food down.
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
## 2017-12-20 LAB — URINE CULTURE
CULTURE: NO GROWTH
SPECIAL REQUESTS: NORMAL
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
